# Patient Record
Sex: Male | Born: 1950 | Race: White | Hispanic: No | Marital: Single
Health system: Southern US, Community
[De-identification: ages and names within clinical notes are randomized; demographics above are authoritative.]

---

## 2008-12-29 ENCOUNTER — Emergency Department: Payer: Self-pay

## 2008-12-29 ENCOUNTER — Inpatient Hospital Stay (HOSPITAL_COMMUNITY): Admission: EM | Admit: 2008-12-29 | Discharge: 2008-12-30 | Payer: Self-pay | Admitting: Emergency Medicine

## 2009-02-17 ENCOUNTER — Emergency Department: Payer: Self-pay | Admitting: Internal Medicine

## 2010-07-13 LAB — CBC
HCT: 29 % — ABNORMAL LOW (ref 39.0–52.0)
MCV: 79.5 fL (ref 78.0–100.0)
Platelets: 157 10*3/uL (ref 150–400)
RBC: 3.65 MIL/uL — ABNORMAL LOW (ref 4.22–5.81)
RDW: 16.3 % — ABNORMAL HIGH (ref 11.5–15.5)

## 2010-07-13 LAB — URINE CULTURE

## 2010-07-13 LAB — URINALYSIS, ROUTINE W REFLEX MICROSCOPIC
Bilirubin Urine: NEGATIVE
Ketones, ur: NEGATIVE mg/dL
Leukocytes, UA: NEGATIVE
Protein, ur: >300 mg/dL — AB
Specific Gravity, Urine: 1.018 (ref 1.005–1.030)
Urobilinogen, UA: 0.2 mg/dL (ref 0.0–1.0)

## 2010-07-13 LAB — BASIC METABOLIC PANEL
BUN: 23 mg/dL (ref 6–23)
GFR calc non Af Amer: >60 mL/min (ref >60)
Glucose, Bld: 170 mg/dL — ABNORMAL HIGH (ref 70–99)
Potassium: 4.5 mEq/L (ref 3.5–5.1)

## 2010-07-13 LAB — RPR: RPR Ser Ql: NONREACTIVE

## 2010-07-13 LAB — GLUCOSE, CAPILLARY: Glucose-Capillary: 278 mg/dL — ABNORMAL HIGH (ref 70–99)

## 2010-07-13 LAB — URINE MICROSCOPIC-ADD ON

## 2010-08-11 ENCOUNTER — Emergency Department: Payer: Self-pay | Admitting: Emergency Medicine

## 2011-09-08 ENCOUNTER — Inpatient Hospital Stay: Payer: Self-pay | Admitting: Internal Medicine

## 2011-09-08 LAB — COMPREHENSIVE METABOLIC PANEL
Alkaline Phosphatase: 140 U/L — ABNORMAL HIGH (ref 50–136)
Bilirubin,Total: 0.4 mg/dL (ref 0.2–1.0)
Calcium, Total: 8.4 mg/dL — ABNORMAL LOW (ref 8.5–10.1)
Creatinine: 1.68 mg/dL — ABNORMAL HIGH (ref 0.60–1.30)
EGFR (Non-African Amer.): 43 — ABNORMAL LOW
Glucose: 259 mg/dL — ABNORMAL HIGH (ref 65–99)
Potassium: 4.7 mmol/L (ref 3.5–5.1)
SGPT (ALT): 29 U/L
Sodium: 138 mmol/L (ref 136–145)
Total Protein: 7.1 g/dL (ref 6.4–8.2)

## 2011-09-08 LAB — CBC
MCH: 28.7 pg (ref 26.0–34.0)
MCHC: 33.9 g/dL (ref 32.0–36.0)
MCV: 85 fL (ref 80–100)
Platelet: 175 10*3/uL (ref 150–440)
RBC: 4.92 10*6/uL (ref 4.40–5.90)
RDW: 13.4 % (ref 11.5–14.5)

## 2011-09-08 LAB — CK TOTAL AND CKMB (NOT AT ARMC): CK-MB: 1.5 ng/mL (ref 0.5–3.6)

## 2011-09-08 LAB — TROPONIN I: Troponin-I: <0.02 ng/mL

## 2011-09-08 LAB — LIPASE, BLOOD: Lipase: 302 U/L (ref 73–393)

## 2011-09-08 LAB — ETHANOL: Ethanol: <3 mg/dL

## 2011-09-08 LAB — PROTIME-INR: INR: 1.1

## 2011-09-09 LAB — DRUG SCREEN, URINE
Amphetamines, Ur Screen: NEGATIVE (ref ?–1000)
Barbiturates, Ur Screen: NEGATIVE (ref ?–200)
MDMA (Ecstasy)Ur Screen: POSITIVE (ref ?–500)
Opiate, Ur Screen: NEGATIVE (ref ?–300)
Tricyclic, Ur Screen: NEGATIVE (ref ?–1000)

## 2011-09-09 LAB — BASIC METABOLIC PANEL
Anion Gap: 10 (ref 7–16)
BUN: 34 mg/dL — ABNORMAL HIGH (ref 7–18)
Co2: 19 mmol/L — ABNORMAL LOW (ref 21–32)
EGFR (African American): 54 — ABNORMAL LOW
Glucose: 145 mg/dL — ABNORMAL HIGH (ref 65–99)
Osmolality: 295 (ref 275–301)
Potassium: 4.2 mmol/L (ref 3.5–5.1)
Sodium: 143 mmol/L (ref 136–145)

## 2011-09-09 LAB — URINALYSIS, COMPLETE
Ketone: NEGATIVE
Nitrite: NEGATIVE
Ph: 5 (ref 4.5–8.0)
RBC,UR: 4 /HPF (ref 0–5)
Squamous Epithelial: <1
WBC UR: 206 /HPF (ref 0–5)

## 2012-09-06 DEATH — deceased

## 2014-02-01 IMAGING — CT CT ABD-PELV W/O CM
1 of 2 series · 14 of 32 positions shown, 18 images · non-contrast
Comparison: none

REASON FOR EXAM: (1) pain  vomiting  h/o liver  bladder cancer and kidney
cancer; (2) same  no or
COMMENTS:

[Series 2: soft tissue · axial · 0.82mm/px · z∈[-732,-270]mm · 14 of 168 slices shown, 18 images]
[im 7/168  soft-tissue]
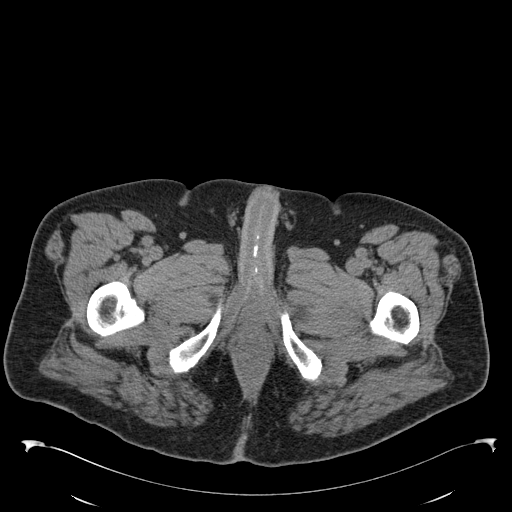
[im 7/168  bone]
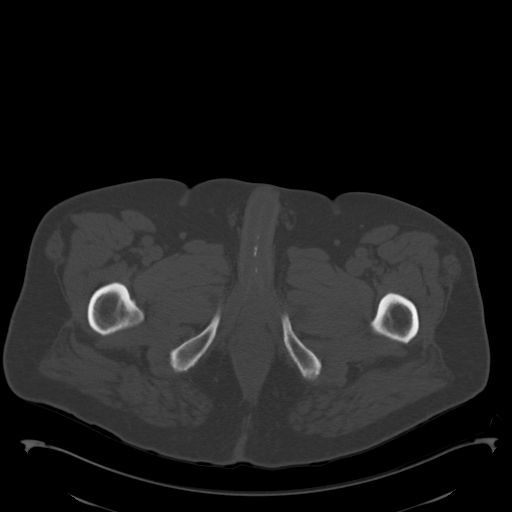
[im 21/168  soft-tissue]
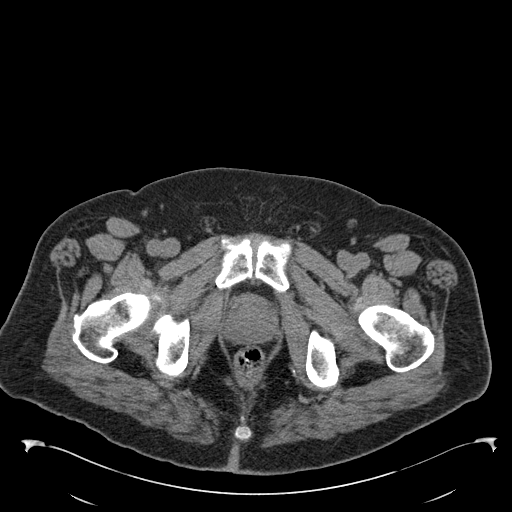
[im 35/168  soft-tissue]
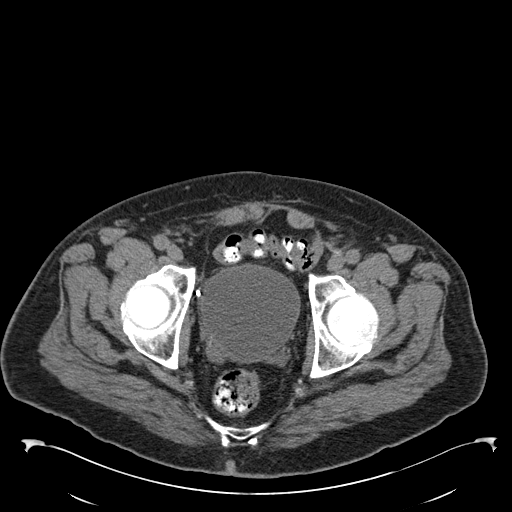
[im 49/168  soft-tissue]
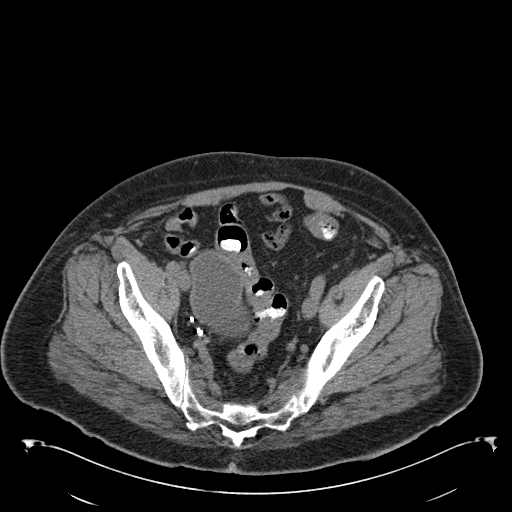
[im 63/168  soft-tissue]
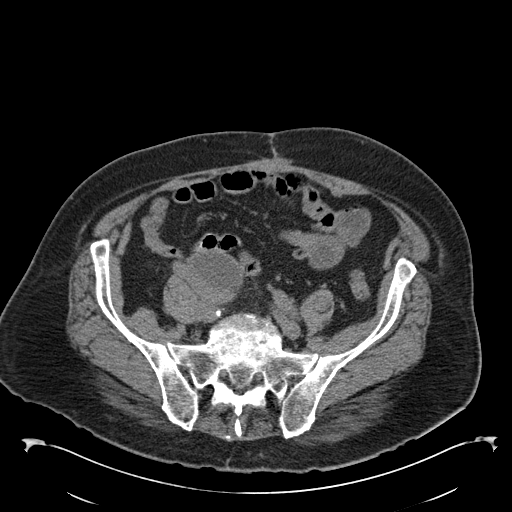
[im 77/168  soft-tissue]
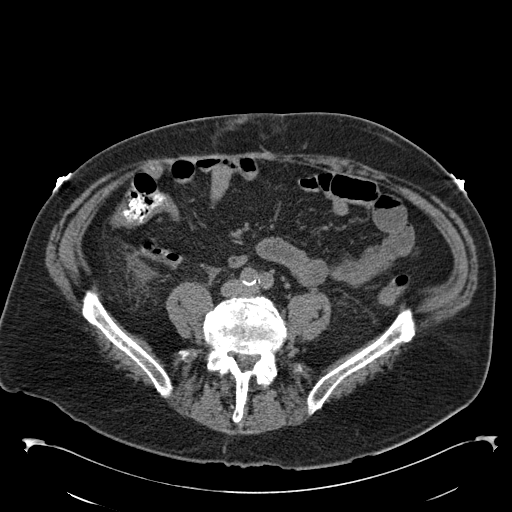
[im 91/168  soft-tissue]
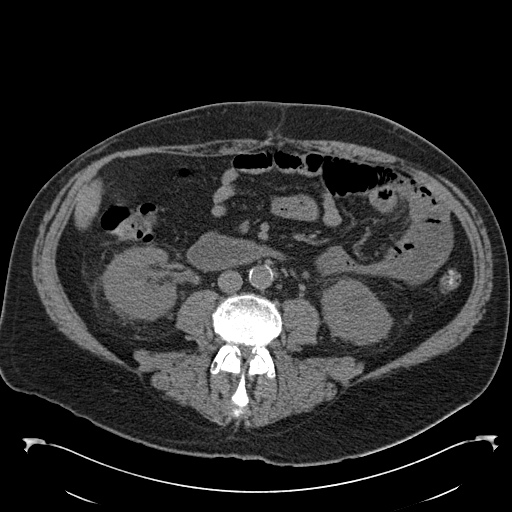
[im 105/168  soft-tissue]
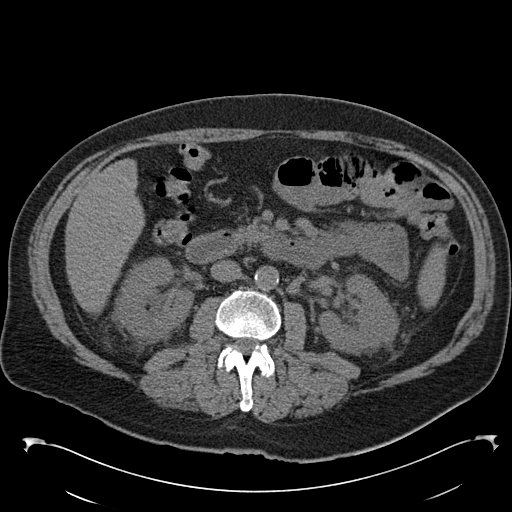
[im 119/168  soft-tissue]
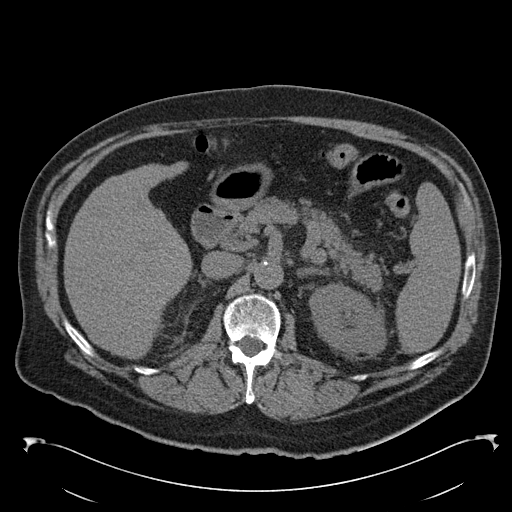
[im 119/168  bone]
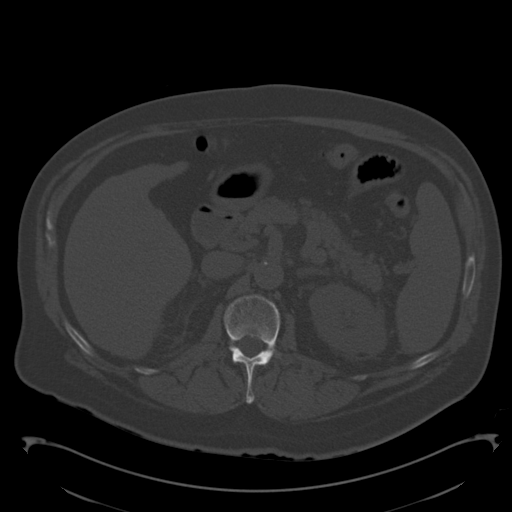
[im 133/168  soft-tissue]
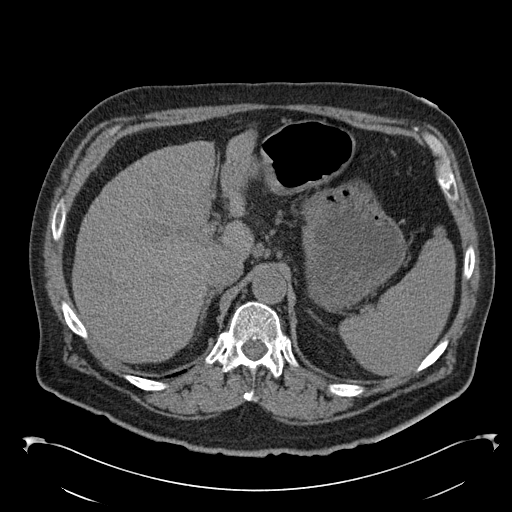
[im 140/168  lung]
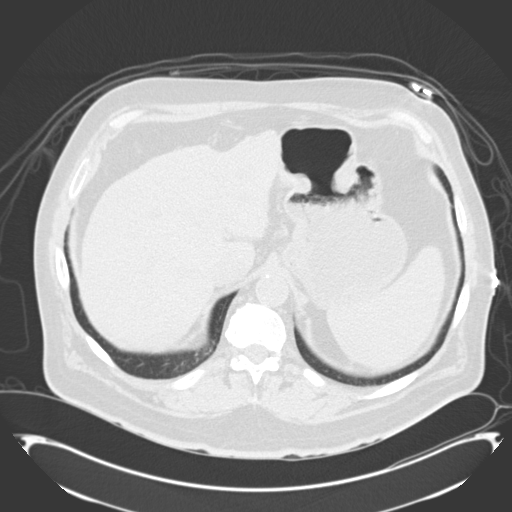
[im 147/168  soft-tissue]
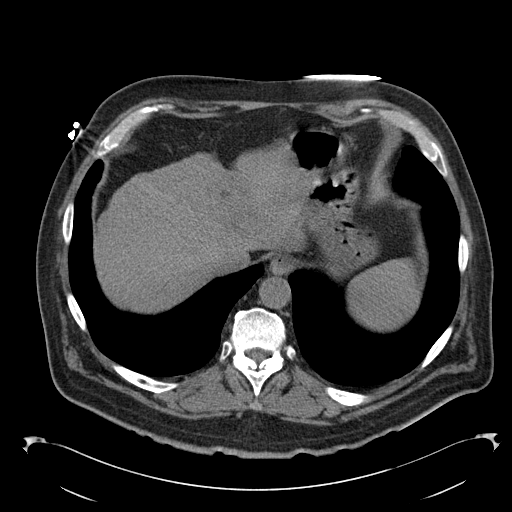
[im 147/168  lung]
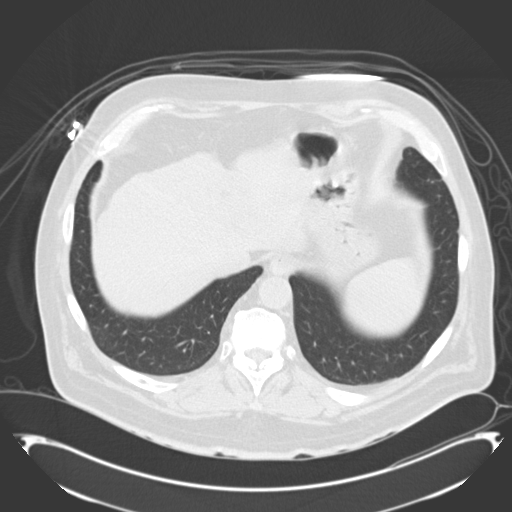
[im 154/168  lung]
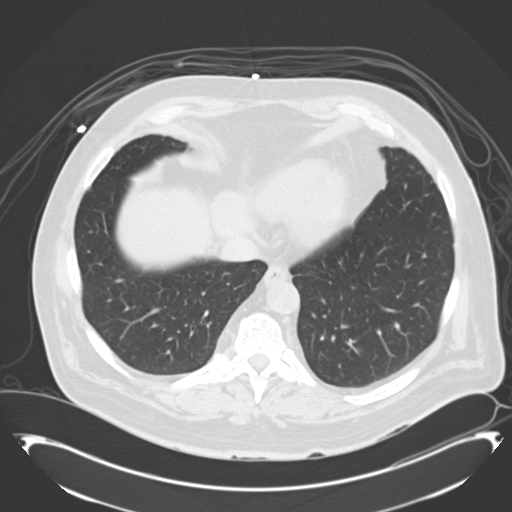
[im 161/168  soft-tissue]
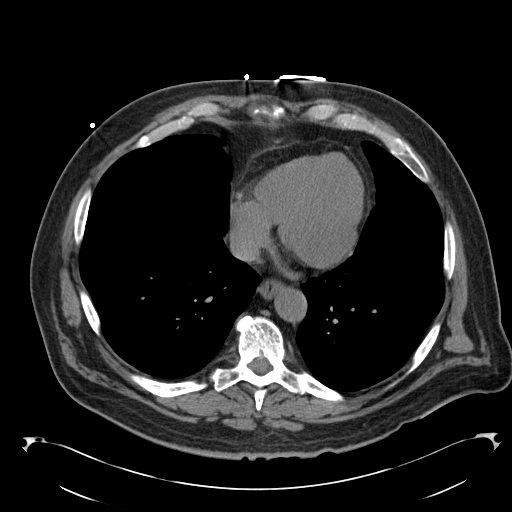
[im 161/168  lung]
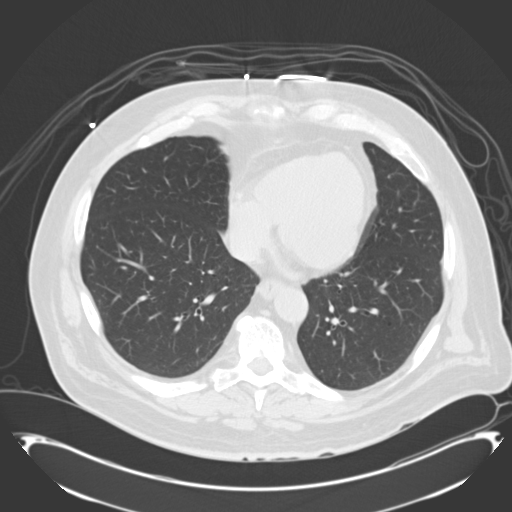

[14 of 32 positions shown; findings below may reference images not displayed]

PROCEDURE:     CT  - CT ABDOMEN AND PELVIS W[DATE]  [DATE]

RESULT:     Axial noncontrast CT scanning was performed through the abdomen
and pelvis with reconstructions at 3 mm intervals and slice thicknesses.
Review of multiplanar reconstructed images was performed separately on the
VIA monitor.

In the left hepatic lobe near the dome anteromedially there is a hypodense
structure measuring 2 cm in diameter. This was visible on a study 11 August, 2010. I see no intrahepatic ductal dilation. The spleen is mildly enlarged
at just over 12 cm in greatest dimension. The stomach is partially distended
with fluid. The pancreatic head and body exhibit no acute abnormality.
Mildly increased density in the peripancreatic fat in the distal body and
tail is seen. There are no adrenal masses. The gallbladder is surgically
absent. The caliber of the abdominal aorta is normal. There is a retroaortic
left renal vein. There are a few normal sized to slightly enlarged
periaortic lymph nodes.

There is increased density in the perinephric fat bilaterally which could
reflect pyelonephritis in the appropriate clinical setting. There is a
nonobstructing lower pole stone in the right kidney measuring 5 mm in
diameter. There are surgical clips associated with a mildly prominent right
ureter; the patient may have undergone a ureteral reimplantation assess on
the right. On the left I do not see definite ureteral dilation. Within the
pelvis the partially distended urinary bladder exhibits no acute abnormality
but there is extension of the supero-right lateral aspect of the bladder
likely related to a previous ureteral reimplantation procedure. The seminal
vesicles and prostate gland are normal in appearance. There is no evidence
of ascites.

The unopacified loops of small and large bowel exhibit no acute abnormality.
There are scattered diverticula but I do not see objective evidence of an
acute of acute diverticulitis. A normal calibered appendix is demonstrated
on image 95.

The lumbar vertebral bodies are preserved in height. There is degenerative
disc change at L4-L5 and at L5-S1. The lung bases are clear.
IMPRESSION: 1. There is increased density in the perinephric fat bilaterally which may
reflect acute or chronic processes given the appearance on the previous
study. I cannot exclude pyelonephritis. Chronic hydronephrosis on the right
is suspected likely related to the previous ureteral implantation procedure
on the right.
2. Mildly increased density in the peripancreatic fat associated with the
distal body and tail could reflect pancreatitis.
3. There is a nonspecific left lobe hepatic lesion which appears stable.
There is mild splenomegaly.
4. I see no acute bowel abnormality.

A followup contrast enhanced CT scan may be useful and is available upon
request.

A preliminary report was sent to the [HOSPITAL] the conclusion
of the study.

## 2014-07-31 NOTE — Consult Note (Signed)
PATIENT NAME:  Lee Richardson, HOFFERT MR#:  [id number] DATE OF BIRTH:  1950/11/01  DATE OF CONSULTATION:  09/09/2011  REFERRING PHYSICIAN:   CONSULTING PHYSICIAN:  Lurline Del, MD  CHIEF COMPLAINT: Abdominal pain, nausea, and vomiting.   HISTORY OF PRESENT ILLNESS: The patient is a 64 year old male with history of kidney cancer with mets to the bladder and liver and undergoing chemotherapy, history of hypertension, diabetes, and chronic kidney disease. According to the patient, about two days ago he started to feel sick to the stomach with some diffuse abdominal pain followed by nausea and vomiting. The patient came to the Emergency Room where he was admitted with a diagnosis of acute pancreatitis. His amylase and lipase were both normal. As of today the patient feels well. Denies any further nausea or vomiting. Denies any abdominal pain. He has no history of pancreatitis and he does not drink.   PAST MEDICAL HISTORY: As above.   PAST SURGICAL HISTORY:  1. Cholecystectomy.  2. Kidney surgery. 3. Back surgeries x2.   SOCIAL HISTORY: He smokes about a pack a day. No history of alcohol.   MEDICATIONS AT HOME:  1. Amlodipine. 2. Isosorbide. 3. Lisinopril. 4. Metoprolol. 5. BuSpar. 6. Hydralazine. 7. Omeprazole. 8. Rosuvastatin.   9. Trazodone. 10. Sorafenib.   ALLERGIES: Morphine.   REVIEW OF SYSTEMS: Grossly negative except for what is mentioned in the history of present illness.  PHYSICAL EXAMINATION:   GENERAL: Well built male who does not appear to be in any acute distress, Fully awake, alert, and oriented. No jaundice was noted.   VITAL SIGNS: Temperature 98.2, pulse 57, blood pressure 120/65.   NECK: Neck veins are flat.   LUNGS: Clear to auscultation bilaterally. No added sounds.   CARDIOVASCULAR: Regular rate and rhythm. S1, S2 normal.   ABDOMEN: Soft and benign. Bowel sounds positive. Nontender, nondistended. No rebound or guarding. No hepatosplenomegaly. No ascites.    NEUROLOGIC: He is fully awake, alert, and oriented.   LABORATORY, DIAGNOSTIC, AND RADIOLOGICAL DATA: Creatinine 1.68. Liver enzymes are normal except for alkaline phosphatase which is mildly elevated at 140. Serum lipase is 302 which is normal. Amylase is 68 which is normal. CBC within normal limits. D-dimer was high at 5.   V/Q scan could not be completed but patient denied any chest pain.   CT scan of the abdomen and pelvis showed a pretty normal appearing pancreas except for mild increased density in the peripancreatic fat in the distal body and tail. There is some perinephric stranding as well and it's not clear if the patient has chronic stranding versus hydronephrosis versus pancreatitis but, as mentioned above, amylase and lipase are normal and the patient's abdominal pain has been completely resolved within a short period of time.   ASSESSMENT AND PLAN: There is no evidence of acute pancreatitis. Lipase x2 is normal. Amylase is normal. Pancreas appears fairly normal on CT scan except for some peripancreatic stranding near the tail but there is perinephric stranding in that area as well and these findings may just be chronic. Clinically this does not appear to be an episode of acute pancreatitis and is more consistent with gastroenteritis, may be viral. The patient's abdominal pain as well as nausea and vomiting has been completely resolved. I would advance his diet and if he remains asymptomatic no further GI recommendations. The patient's urinalysis is very abnormal. I would recommend following urine culture results and then making a determination if antibiotics are needed or not.   Will follow.  ____________________________ Lurline DelShaukat Jash Wahlen, MD si:drc D: 09/09/2011 19:56:00 ET T: 09/10/2011 10:13:44 ET JOB#: [id number]  cc: Lurline DelShaukat Korena Nass, MD, <Dictator>  Lurline DelSHAUKAT Gabriele Loveland MD ELECTRONICALLY SIGNED 09/12/2011 12:18

## 2014-07-31 NOTE — Consult Note (Signed)
Brief Consult Note: Diagnosis: ? Pancreatitis.   Patient was seen by consultant.   Consult note dictated.   Comments: No evidence of acute pancreatitis. The episode may have been a viral gastroentritis. He feels much better with almost complete resolution of his symptoms. Abnormal UA, ? UTI.  Recommendations: Advance diet. Will follow. Thanks.  Electronic Signatures: Lurline DelIftikhar, Elzada Pytel (MD)  (Signed 03-Jun-13 19:52)  Authored: Brief Consult Note   Last Updated: 03-Jun-13 19:52 by Lurline DelIftikhar, Ayaz Sondgeroth (MD)

## 2014-07-31 NOTE — Discharge Summary (Signed)
PATIENT NAME:  Lee Richardson, [name] MR#:  [id number] DATE OF BIRTH:  Dec 23, 1950  DATE OF ADMISSION:  09/08/2011 DATE OF DISCHARGE:  09/10/2011  [facility]   DISCHARGE DIAGNOSES:  1. Viral gastroenteritis, now improving, tolerating diet. No pancreatitis, has been ruled out. 2. Chronic kidney disease, stage 3, with a creatinine at baseline.  3. Urinary tract infection based on urinalysis. The patient was started on ciprofloxacin on discharge. Cultures are still pending.  4. Tobacco abuse. The patient was counseled for about three minutes.   SECONDARY DIAGNOSES:  1. History of kidney cancer with metastases to bladder and to the liver.  2. Hypertension. 3. Chronic kidney disease, stage 3.  4. Type 2 diabetes.  5. Depression.   CONSULTATIONS: GI, Dr. Niel HummerIftikhar.   LABORATORY, DIAGNOSTIC AND RADIOLOGICAL DATA:  CT scan of the head without contrast on June 2nd showed no evidence of acute intracranial abnormality.  CT scan of the abdomen and pelvis without contrast on June 2nd showed possible pancreatitis. No acute bowel abnormality. Chronic hydronephrosis on the right likely related to previous ureteral implantation procedure on the right.  VQ scan was unable to be performed as the patient refused.  Chest x-ray showed no acute cardiopulmonary disease.  Urinalysis showed trace bacteria, 206 WBCs, 2+ leukocyte esterase, and WBC in clumps.   HISTORY AND SHORT HOSPITAL COURSE: The patient is a 64 year old male with the above-mentioned medical problems who was admitted for possible pancreatitis as he was having epigastric pain, nausea and vomiting, and CT scan showed possible pancreatitis. He was evaluated by GI, Dr. Niel HummerIftikhar, who did not feel this to be an episode of pancreatitis and it was felt to be viral gastroenteritis. He did not have any elevated lipase or amylase, and his symptoms resolved in 24 to 48 hours. He was tolerating diet and was doing much better. He  was discharged home on June 4th in stable condition.   PERTINENT DISCHARGE PHYSICAL EXAMINATION: VITAL SIGNS: On the date of discharge, his vital signs temperature 98.3, heart rate 63, pulmonary respirations 18 per minute, blood pressure 149/69 mmHg. He was saturating 98% on room air. CARDIOVASCULAR: S1, S2 normal. No murmurs, rubs, or gallop. LUNGS: Clear to auscultation bilaterally. No wheezing, rales, rhonchi, or crepitation. ABDOMEN: Soft, benign. NEUROLOGICAL: Nonfocal examination. All other physical examination remained at the baseline.   DISCHARGE MEDICATIONS:  1. Multivitamin once daily.  2. Hydralazine 25 mg p.o. t.i.d.  3. Metoprolol 50 mg, 1-1/2 tablets b.i.d.   4. Bupropion 150 mg p.o. at bedtime.  5. Rosuvastatin 5 mg, 1/2 tablet p.o. daily.  6. Amlodipine 10 mg p.o. daily.  7. Lisinopril 20 mg, 1/2 tablet p.o. daily.  8. Omeprazole 20 mg p.o. daily.  9. Trazodone 50 mg, 2 tablets p.o. at bedtime.  10. Tamsulosin 0.4 mg p.o. daily.  11. Isosorbide mononitrate 30 mg p.o. daily.  12. Methocarbamol 750 mg p.o. daily as needed.  13. Sorafenib 200 mg, 2 tablets p.o. b.i.d.  14. Mirtazapine 15 mg, 3 tablets p.o. at bedtime.  15. Colace 100 mg, 1 capsule p.o. at bedtime.  16. Ciprofloxacin 500 mg p.o. b.i.d. for 3 days.   DISCHARGE DIET: Low sodium.   DISCHARGE ACTIVITY: As tolerated.   DISCHARGE INSTRUCTIONS AND FOLLOWUP:  The patient was instructed to [facility] up [facility] in 1 to 2 weeks.      TOTAL TIME DISCHARGING THIS PATIENT: 55 minutes.   ____________________________ Ellamae SiaVipul S. Sherryll BurgerShah, MD vss:cbb  D: 09/11/2011 13:12:03 ET T: 09/11/2011 15:31:27 ET JOB#: [id number]  cc: Corbyn Wildey S. Sherryll Burger, MD, <Dictator> [facility] Ellamae Sia [facility] MD ELECTRONICALLY SIGNED 09/11/2011 19:30

## 2014-07-31 NOTE — H&P (Signed)
PATIENT NAME:  Lee Richardson, Lee Richardson MR#:  [id number] DATE OF BIRTH:  11/03/1950  DATE OF ADMISSION:  09/08/2011  PRIMARY CARE PHYSICIAN: [facility]   DATE OF ADMISSION: 09/08/2011   CHIEF COMPLAINT: Three-day history of epigastric pain associated with nausea and vomiting.   HISTORY OF PRESENT ILLNESS: Mr. Bhakta is a 64 year old Caucasian male usually followed by the [facility]. He has kidney cancer with metastasis to the bladder and to the liver, undergoing or receiving chemotherapy. He also has history of systemic hypertension, diabetes, and chronic kidney disease. The patient is complaining goal of epigastric pain for the last three days associated with nausea and vomiting. The pain is dull, and  severity was 8 on a scale of 10. Evaluation in the Emergency Department was consistent with acute pancreatitis, although in the beginning there was conflicting history. The patient was not giving adequate information and reporting lower chest pain, but he meant epigastric as he was referring to the epigastric area. There was attempt to do V/Q scan of the chest to rule out pulmonary embolism by the Emergency Department; however, he was restless and the test was aborted. His D-dimer is elevated, but this is nonspecific.   REVIEW OF SYSTEMS: CONSTITUTIONAL: The patient denies having any fever. No chills, reports some fatigue. EYES: No blurring of vision. No double vision. ENT: No hearing impairment. No sore throat. No dysphagia. CARDIOVASCULAR: No chest pain but has epigastric pain. No shortness of breath. No syncope. RESPIRATORY: No cough. No sputum production. No chest pain or shortness of breath. GASTROINTESTINAL: Reported epigastric pain, nausea and vomiting, no diarrhea. GENITOURINARY: No dysuria. No frequency of urination. MUSCULOSKELETAL: No joint pain or swelling. No muscular pain or swelling. INTEGUMENTARY: No skin rash. No ulcers. NEUROLOGY: No focal weakness. No seizure activity. No headache. PSYCHIATRY:  He has history of depression. No anxiety. ENDOCRINE: No polyuria or polydipsia. No heat or cold intolerance.   PAST MEDICAL HISTORY:  1. The patient has kidney cancer with metastasis to the bladder and to the liver.  2. Severe systemic hypertension.  3. Chronic kidney disease, stage III.  4. Diabetes mellitus, type 2, uncontrolled.  5. Depression.   PAST SURGICAL HISTORY:  1. Cholecystectomy. 2. Kidney stone surgery. 3. Nonspecific kidney surgery after diagnosis of his cancer.  4. Back surgery x2.   SOCIAL HABITS: Chronic smoker of 1 pack per day since age of 39. No history of alcoholism or drug abuse.   SOCIAL HISTORY: He is married, living with his wife. 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   FAMILY HISTORY: He has a brother who has colon cancer, has a sister who has breast cancer, and his mother suffered from Parkinson's disease.   ADMISSION MEDICATIONS:  1. Amlodipine 10 mg once a day. 2. Isosorbide mononitrate 30 mg once a day. 3. Lisinopril 20 mg, taking 1/2 tablet a day.  4. Metoprolol 50 mg, taking 1-1/2 tablets at 75 mg b.i.d.  5. Mirtazapine 15 mg, 3 tablets once a day, that is 45 mg once a day.  6. Bupropion 150 mg once a day. 7. Docusate sodium 100 mg, 2 capsules b.i.d.   8. Hydralazine 25 mg t.i.d.  9. Methocarbamol 750 mg once a day p.r.n.  10. Multivitamin once a day. 11. Omeprazole 20 mg once a day.  12. Rosuvastatin  5 mg, taking 1/2 tablet once a day.  13. Tamsulosin 0.4 mg capsule once a day.  14. Trazodone 50 mg, taking 2 tablets at  bedtime.  15. Sorafenib 200 mg, taking 2 tablets b.i.d.    ALLERGIES: Morphine causing swelling.     PHYSICAL EXAMINATION:  GENERAL APPEARANCE: Elderly male laying in bed, appears in pain, but now he is well sedated after receiving fentanyl. He is in no acute distress.   HEENT: Head/Neck: No pallor. No icterus. No cyanosis. Ears, nose and throat: Hearing was normal. Nasal mucosa, lips, tongue  were normal. Eyes: Examination revealed normal iris and conjunctivae. Pupils are about 6 mm, equal and reactive to light.   NECK: Supple. Trachea at midline. No thyromegaly. No cervical lymphadenopathy. No masses.   HEART: Normal S1, S2. No S3 or S4. No murmur. No gallop No carotid bruits.   RESPIRATORY: Normal breathing pattern without use of accessory muscles. No rales. No wheezing.   ABDOMEN: Soft. There is tenderness at the epigastric area. No rebound. No rigidity. No hernia. No hepatosplenomegaly.   MUSCULOSKELETAL: No joint swelling. No clubbing.   SKIN: No ulcers. No subcutaneous nodules.   NEUROLOGIC: Cranial nerves II through XII are intact. No focal motor deficit.   PSYCHIATRY: The patient is alert, oriented to place, people and time. Mood and affect were flat.   LABORATORY, DIAGNOSTIC AND RADIOLOGICAL DATA:  CT scan of the abdomen and pelvis revealed no evidence of pneumothorax. The gallbladder is surgically absent. There is mild peripancreatic inflammatory stranding and fluid at the tail of the pancreas consistent with acute pancreatitis. There are nonobstructive calyceal stones at the right renal pelvis. There is mild right hydronephrosis.  EKG showed normal sinus rhythm with rate of 98 per minute. Normal EKG. Blood sugar was 259, BUN 31, creatinine 1.6, with estimated GFR at 43. It is worth mentioning that his baseline creatinine was 1.7 in May 2012. Sodium 138, potassium 4.7.  His CO2 of serum was slightly low at 17. Anion gap was normal at 11.  Alcohol level was less than 3.0.  Lipase was 302. Calcium was 8.4; however, his albumin was low at 2.8. Total protein 7.1. Alkaline phosphatase elevated at 140. AST and ALT were normal, 27 and 29 respectively. Total CPK was 71. Troponin less than 0.02.  CBC showed a white count 10,000, hemoglobin 14, hematocrit 41, platelet count 175. Prothrombin time 14, INR 1.1. APTT 27. D-dimer elevated at 5.0.   IMPRESSION:  1. Epigastric pain  secondary to acute pancreatitis.  2. Kidney cancer with metastasis to the liver and bladder.  3. Systemic hypertension.  4. Chronic kidney disease, stage III.  5. Diabetes mellitus, type 2, uncontrolled.  6. Mild hypocalcemia but consistent with low albumin of 2.8.  7. Kidney stones.  8. Mild right hydronephrosis.  9. History of cholecystectomy. 10. History of back surgery x2.  11. Tobacco abuse.   PLAN:  1. Admit the patient to the Medical floor. Keep patient n.p.o. except for the necessary medications. Pain control. Antiemetic using Zofran p.r.n.  2. I will continue home medications, especially his blood pressure medications given his severe hypertension.  3. I will hold the unnecessary medications, and I will also hold Sorafenib for his cancer as one of the side effects is abdominal pain and elevated amylase as reported in the literature.  4. I discussed the findings with the patient's family, in particular his wife and his daughter. She indicated that he has a LIVING WILL, and he had appointed his wife to have the Power of [place].   TIME SPENT EVALUATING THIS PATIENT: More than one hour.  ____________________________ Carney CornersAmir M. Rudene Rearwish, MD amd:cbb D:  09/08/2011 23:18:35 ET T: 09/09/2011 07:59:36 ET JOB#: [id number] cc: Carney Corners. Rudene Re, MD, <Dictator> [facility] Carney Corners Areya Lemmerman MD ELECTRONICALLY SIGNED 09/10/2011 1:42
